# Patient Record
Sex: Male | Born: 1995 | Race: White | Hispanic: No | Marital: Single | Smoking: Current some day smoker
Health system: Southern US, Community
[De-identification: ages and names within clinical notes are randomized; demographics above are authoritative.]

---

## 2016-02-24 DIAGNOSIS — R519 Headache, unspecified: Secondary | ICD-10-CM | POA: Insufficient documentation

## 2016-03-31 DIAGNOSIS — M545 Low back pain, unspecified: Secondary | ICD-10-CM | POA: Insufficient documentation

## 2016-03-31 DIAGNOSIS — F419 Anxiety disorder, unspecified: Secondary | ICD-10-CM | POA: Insufficient documentation

## 2016-09-29 DIAGNOSIS — G43909 Migraine, unspecified, not intractable, without status migrainosus: Secondary | ICD-10-CM | POA: Insufficient documentation

## 2017-06-08 DIAGNOSIS — R369 Urethral discharge, unspecified: Secondary | ICD-10-CM | POA: Insufficient documentation

## 2017-08-23 DIAGNOSIS — B354 Tinea corporis: Secondary | ICD-10-CM | POA: Insufficient documentation

## 2017-08-23 DIAGNOSIS — L301 Dyshidrosis [pompholyx]: Secondary | ICD-10-CM | POA: Insufficient documentation

## 2017-08-23 DIAGNOSIS — F17200 Nicotine dependence, unspecified, uncomplicated: Secondary | ICD-10-CM | POA: Insufficient documentation

## 2018-04-18 ENCOUNTER — Emergency Department (HOSPITAL_COMMUNITY)
Admission: EM | Admit: 2018-04-18 | Discharge: 2018-04-18 | Disposition: A | Discharge status: Home / Self Care | Payer: Non-veteran care | Attending: Emergency Medicine | Admitting: Emergency Medicine

## 2018-04-18 ENCOUNTER — Emergency Department (HOSPITAL_COMMUNITY): Payer: Non-veteran care

## 2018-04-18 DIAGNOSIS — Y990 Civilian activity done for income or pay: Secondary | ICD-10-CM | POA: Diagnosis not present

## 2018-04-18 DIAGNOSIS — Y9289 Other specified places as the place of occurrence of the external cause: Secondary | ICD-10-CM | POA: Insufficient documentation

## 2018-04-18 DIAGNOSIS — Z23 Encounter for immunization: Secondary | ICD-10-CM | POA: Insufficient documentation

## 2018-04-18 DIAGNOSIS — W540XXD Bitten by dog, subsequent encounter: Secondary | ICD-10-CM | POA: Insufficient documentation

## 2018-04-18 DIAGNOSIS — Y939 Activity, unspecified: Secondary | ICD-10-CM | POA: Diagnosis not present

## 2018-04-18 DIAGNOSIS — S61432D Puncture wound without foreign body of left hand, subsequent encounter: Secondary | ICD-10-CM | POA: Insufficient documentation

## 2018-04-18 DIAGNOSIS — S61452D Open bite of left hand, subsequent encounter: Secondary | ICD-10-CM

## 2018-04-18 LAB — BASIC METABOLIC PANEL
Anion gap: 8 (ref 5–15)
BUN: 16 mg/dL (ref 6–20)
CALCIUM: 9.7 mg/dL (ref 8.9–10.3)
CO2: 26 mmol/L (ref 22–32)
Chloride: 104 mmol/L (ref 98–111)
Creatinine, Ser: 1.08 mg/dL (ref 0.61–1.24)
GFR calc Af Amer: >60 mL/min (ref >60)
GFR calc non Af Amer: >60 mL/min (ref >60)
GLUCOSE: 88 mg/dL (ref 70–99)
Potassium: 4.7 mmol/L (ref 3.5–5.1)
Sodium: 138 mmol/L (ref 135–145)

## 2018-04-18 LAB — CBC WITH DIFFERENTIAL/PLATELET
Abs Immature Granulocytes: 0.03 10*3/uL (ref 0.00–0.07)
BASOS PCT: 1 %
Basophils Absolute: 0.1 10*3/uL (ref 0.0–0.1)
EOS ABS: 0.2 10*3/uL (ref 0.0–0.5)
Eosinophils Relative: 3 %
HCT: 42.8 % (ref 39.0–52.0)
Hemoglobin: 15.1 g/dL (ref 13.0–17.0)
Immature Granulocytes: 1 %
Lymphocytes Relative: 25 %
Lymphs Abs: 1.7 10*3/uL (ref 0.7–4.0)
MCH: 32.8 pg (ref 26.0–34.0)
MCHC: 35.3 g/dL (ref 30.0–36.0)
MCV: 93 fL (ref 80.0–100.0)
Monocytes Absolute: 0.6 10*3/uL (ref 0.1–1.0)
Monocytes Relative: 9 %
Neutro Abs: 4 10*3/uL (ref 1.7–7.7)
Neutrophils Relative %: 61 %
Platelets: 279 10*3/uL (ref 150–400)
RBC: 4.6 MIL/uL (ref 4.22–5.81)
RDW: 11.1 % — AB (ref 11.5–15.5)
WBC: 6.5 10*3/uL (ref 4.0–10.5)
nRBC: 0 % (ref 0.0–0.2)

## 2018-04-18 MED ORDER — TETANUS-DIPHTH-ACELL PERTUSSIS 5-2.5-18.5 LF-MCG/0.5 IM SUSP
0.5000 mL | Freq: Once | INTRAMUSCULAR | Status: AC
  Administered 2018-04-18: 0.5 mL via INTRAMUSCULAR
  Filled 2018-04-18: qty 0.5

## 2018-04-18 NOTE — Discharge Instructions (Addendum)
Finish antibiotics as directed  You can take Tylenol or Ibuprofen as directed for pain. You can alternate Tylenol and Ibuprofen every 4 hours. If you take Tylenol at 1pm, then you can take Ibuprofen at 5pm. Then you can take Tylenol again at 9pm.   Return the emergency department for any high fevers, worsening redness or swelling, worsening pain or any other worsening or concerning symptoms.

## 2018-04-18 NOTE — ED Provider Notes (Signed)
[facility] EMERGENCY DEPARTMENT Provider Note   CSN: [id number] Arrival date & time: 04/18/18  1150     History   Chief Complaint Chief Complaint  Patient presents with  . Antibiotic Allergy/dog bite    HPI John Mayer is a 23 y.o. male presents for evaluation of dog bite to left hand that occurred approximately 5 days ago.  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  He reports that initially right after the incident, he went to urgent care and evaluated and started on doxycycline and Flagyl which he states he has been taking.  He states that the dog is up-to-date on his vaccines.  He does not know when his last tetanus shot was.  He reports he had had some improvement in pain but does report about 2 days ago, pain and redness started increasing.  He reports yesterday, the dorsal aspect of his left hand started swelling more significantly.  He also noted that the redness started spreading up towards the proximal wrist.  He will return to the urgent care and was prompted to come to emergency department for further evaluation.  He states that the hand is sore with movement but he has been able to have full range of motion.  Patient has not had any fevers and he denies any numbness/weakness.  The history is provided by the patient.    No past medical history on file.  There are no active problems to display for this patient.        Home Medications    Prior to Admission medications   Not on File    Family History No family history on file.  Social History Social History   Tobacco Use  . Smoking status: Not on file  Substance Use Topics  . Alcohol use: Not on file  . Drug use: Not on file     Allergies   Patient has no known allergies.   Review of Systems Review of Systems  Constitutional: Negative for fever.  Musculoskeletal:       Hand pain  Skin: Positive for color change and wound.  Neurological: Negative for weakness  and numbness.  All other systems reviewed and are negative.    Physical Exam Updated Vital Signs BP (!) 145/84 (BP Location: Right Arm)   Pulse 64   Temp 98 F (36.7 C) (Oral)   Resp 17   SpO2 97%   Physical Exam Vitals signs and nursing note reviewed.  Constitutional:      Appearance: He is well-developed.  HENT:     Head: Normocephalic and atraumatic.  Eyes:     General: No scleral icterus.       Right eye: No discharge.        Left eye: No discharge.     Conjunctiva/sclera: Conjunctivae normal.  Cardiovascular:     Pulses:          Radial pulses are 2+ on the right side and 2+ on the left side.  Pulmonary:     Effort: Pulmonary effort is normal.  Musculoskeletal:     Comments: Diffuse tenderness palpation to dorsal aspect of the left hand.  Tenderness palpation noted along the extensor tendon of the left middle digit.  Full range of motion of left wrist intact any difficulty.  Full flexion flexion-extension of all 5 digits of left hand intact without any difficulty.  DIPs with full flexion/tension when held in isolation.  Patient can easily make a  fist without any difficulty.  Skin:    General: Skin is warm and dry.     Capillary Refill: Capillary refill takes less than 2 seconds.     Comments: Good distal cap refill. LUE is not dusky in appearance or cool to touch.  Diffuse erythema and warmth overlying the dorsal aspect of the hand that extends over the MCPs but does not cross the PIP.  No overlying induration, edema.  Neurological:     Mental Status: He is alert.     Comments: Equal grip strength bilaterally. Follows commands, Moves all extremities  5/5 strength to BUE and BLE  Sensation intact throughout all major nerve distributions  Psychiatric:        Speech: Speech normal.        Behavior: Behavior normal.          ED Treatments / Results  Labs (all labs ordered are listed, but only abnormal results are displayed) Labs Reviewed  CBC WITH  DIFFERENTIAL/PLATELET - Abnormal; Notable for the following components:      Result Value   RDW 11.1 (*)    All other components within normal limits  BASIC METABOLIC PANEL    EKG None  Radiology Dg Hand Complete Left  Result Date: 04/18/2018 CLINICAL DATA:  23 year old who sustained a dog bite to the LEFT hand approximately 6 days ago, presenting with edema and erythema involving the skin extending from the level of the second metacarpal to the fifth metacarpal despite antibiotic therapy. Initial imaging encounter. EXAM: LEFT HAND - COMPLETE 3+ VIEW COMPARISON:  None. FINDINGS: No evidence of acute fracture or dislocation. No evidence of osteomyelitis. Joint spaces well preserved. Well-preserved bone mineral density. Accessory ossicle/ununited apophysis of the ulnar styloid. No significant intrinsic osseous abnormalities. IMPRESSION: No acute osseous abnormality. Electronically Signed   By: Hulan Saashomas  Lawrence M.D.   On: 04/18/2018 13:35    Procedures Procedures (including critical care time)  Medications Ordered in ED Medications  Tdap (BOOSTRIX) injection 0.5 mL (0.5 mLs Intramuscular Given 04/18/18 1246)     Initial Impression / Assessment and Plan / ED Course  I have reviewed the triage vital signs and the nursing notes.  Pertinent labs & imaging results that were available during my care of the patient were reviewed by me and considered in my medical decision making (see chart for details).    23 year old male who presents for evaluation of left hand redness and swelling that is been for the last 2 days.  Patient reports he was bitten by a dog about 5 days ago.  He was started on doxycycline and Flagyl which he states he has been taking.  Patient reports that 2 days ago, he started noticing increased redness to the dorsal aspect of left hand and states that over the last 3 to 4 hours, the redness and swelling has progressively worsened.  He states he has some soreness with moving the  hand but has full range of motion.  No fevers noted. Patient is afebrile, non-toxic appearing, sitting comfortably on examination table. Vital signs reviewed and stable. Patient is neurovascularly intact.   CBC shows no cytosis.  BMP unremarkable.  Discussed patient with Dr. Janee Mornhompson (Hand).  Will come evaluate patient in the ED.  Discussed with Dr. Janee Mornhompson after evaluation in ED.  He suspect that this is more of a result of traumatic injury rather than infectious process.  There is no edema or induration.  Additionally, he states that when this initially happened, it bled significantly.  Recommends continuing antibiotic as directed and adding anti-inflammatories to patient's treatment course.  Patient is agreeable to plan.Patient given outpatient follow-up. Patient had ample opportunity for questions and discussion. All patient's questions were answered with full understanding. Strict return precautions discussed. Patient expresses understanding and agreement to plan.   Final Clinical Impressions(s) / ED Diagnoses   Final diagnoses:  Dog bite of left hand, subsequent encounter    ED Discharge Orders    None       Rosana HoesLayden, Lindsey A, PA-C 04/19/18 1954    Rolan BuccoBelfi, Melanie, MD 04/19/18 2320

## 2018-04-18 NOTE — Consult Note (Signed)
ORTHOPAEDIC CONSULTATION HISTORY & PHYSICAL REQUESTING PHYSICIAN: Rolan Bucco, MD  Chief Complaint: left hand problem  HPI: John Mayer is a 23 y.o. male who sustained some superficial punctures from a dog bite this past Friday.  He was initially started that day on oral antibiotics.  Although he remains with minimal pain and no significant swelling, he has developed some increased redness on the dorsum of the hand more distally.  I was consulted regarding treatment recommendations. In discussing this with him, he had fairly heavy venous bleeding at the time of the injury, from 1 of the more distal punctures.  He reports to been taking his antibiotics as prescribed.  No past medical history on file.  Social History   Socioeconomic History  . Marital status: Single    Spouse name: Not on file  . Number of children: Not on file  . Years of education: Not on file  . Highest education level: Not on file  Occupational History  . Not on file  Social Needs  . Financial resource strain: Not on file  . Food insecurity:    Worry: Not on file    Inability: Not on file  . Transportation needs:    Medical: Not on file    Non-medical: Not on file  Tobacco Use  . Smoking status: Not on file  Substance and Sexual Activity  . Alcohol use: Not on file  . Drug use: Not on file  . Sexual activity: Not on file  Lifestyle  . Physical activity:    Days per week: Not on file    Minutes per session: Not on file  . Stress: Not on file  Relationships  . Social connections:    Talks on phone: Not on file    Gets together: Not on file    Attends religious service: Not on file    Active member of club or organization: Not on file    Attends meetings of clubs or organizations: Not on file    Relationship status: Not on file  Other Topics Concern  . Not on file  Social History Narrative  . Not on file   No family history on file. No Known Allergies Prior to Admission medications   Not  on File   Dg Hand Complete Left  Result Date: 04/18/2018 CLINICAL DATA:  23 year old who sustained a dog bite to the LEFT hand approximately 6 days ago, presenting with edema and erythema involving the skin extending from the level of the second metacarpal to the fifth metacarpal despite antibiotic therapy. Initial imaging encounter. EXAM: LEFT HAND - COMPLETE 3+ VIEW COMPARISON:  None. FINDINGS: No evidence of acute fracture or dislocation. No evidence of osteomyelitis. Joint spaces well preserved. Well-preserved bone mineral density. Accessory ossicle/ununited apophysis of the ulnar styloid. No significant intrinsic osseous abnormalities. IMPRESSION: No acute osseous abnormality. Electronically Signed   By: Hulan Saas M.D.   On: 04/18/2018 13:35    Positive ROS: All other systems have been reviewed and were otherwise negative with the exception of those mentioned in the HPI and as above.  Physical Exam: Vitals: Refer to EMR. Constitutional:  WD, WN, NAD HEENT:  NCAT, EOMI Neuro/Psych:  Alert & oriented to person, place, and time; appropriate mood & affect Lymphatic: No generalized extremity edema or lymphadenopathy Extremities / MSK:  The extremities are normal with respect to appearance, ranges of motion, joint stability, muscle strength/tone, sensation, & perfusion except as otherwise noted:  The left hand has no edema.  There  is some reddish discoloration of the skin that is essentially distal to all of the wounds, and the position of more dependency.  He has full active flexion extension of the digits with minimal discomfort.  There is no drainage from any of the wounds, and no redness associated with any of the wounds themselves.  WBC 6.5  Assessment: Left hand redness, likely not of infectious origin, more likely to be subcutaneous hemorrhage tracking into the dependent portions of the hand  Plan: I discussed these findings with him and the requesting provider.  I encouraged him  to continue his antibiotics until gone, adding an oral anti-inflammatory to the mix, working to preserve motion, and seeking reevaluation if his condition deteriorates, especially as it relates to edema and pain.  I indicated that the areas that are presently a little bit reddened in color may actually become darker like more characteristic ecchymosis.    Cliffton Astersavid A. Janee Mornhompson, MD      Orthopaedic & Hand Surgery [facility] [address] [place], [place]  [id number] Office: [phone] Mobile: [phone]  04/18/2018, 2:49 PM

## 2018-04-18 NOTE — ED Triage Notes (Signed)
Patient arrived from home reporting that he has been taking antibiotics since Dec 27th related to dog bite. He reported that his left hand became red and he believes this may be related to the antibiotics

## 2018-04-18 NOTE — ED Notes (Signed)
D/c reviewed with patient 

## 2019-12-05 DIAGNOSIS — M25529 Pain in unspecified elbow: Secondary | ICD-10-CM | POA: Insufficient documentation

## 2019-12-07 DIAGNOSIS — F4310 Post-traumatic stress disorder, unspecified: Secondary | ICD-10-CM | POA: Insufficient documentation

## 2019-12-07 DIAGNOSIS — L409 Psoriasis, unspecified: Secondary | ICD-10-CM | POA: Insufficient documentation

## 2019-12-07 DIAGNOSIS — A630 Anogenital (venereal) warts: Secondary | ICD-10-CM | POA: Insufficient documentation

## 2020-03-02 DIAGNOSIS — D3131 Benign neoplasm of right choroid: Secondary | ICD-10-CM | POA: Insufficient documentation

## 2020-06-05 ENCOUNTER — Encounter: Payer: Self-pay | Admitting: Emergency Medicine

## 2020-06-05 ENCOUNTER — Other Ambulatory Visit: Payer: Self-pay

## 2020-06-05 ENCOUNTER — Emergency Department
Admission: EM | Admit: 2020-06-05 | Discharge: 2020-06-05 | Disposition: A | Discharge status: Home / Self Care | Payer: No Typology Code available for payment source | Attending: Emergency Medicine | Admitting: Emergency Medicine

## 2020-06-05 DIAGNOSIS — F17200 Nicotine dependence, unspecified, uncomplicated: Secondary | ICD-10-CM | POA: Diagnosis not present

## 2020-06-05 DIAGNOSIS — H5713 Ocular pain, bilateral: Secondary | ICD-10-CM | POA: Diagnosis present

## 2020-06-05 DIAGNOSIS — H10023 Other mucopurulent conjunctivitis, bilateral: Secondary | ICD-10-CM | POA: Insufficient documentation

## 2020-06-05 MED ORDER — GENTAMICIN SULFATE 0.3 % OP SOLN: 1.0000 [drp] | OPHTHALMIC | 0 refills | Status: DC

## 2020-06-05 NOTE — Discharge Instructions (Addendum)
Follow discharge care instructions and use eyedrops as directed. 

## 2020-06-05 NOTE — ED Triage Notes (Signed)
Pt comes into the ED via POV c/o left eye pain and discharge since Tuesday.  Pt in [facility].

## 2020-06-05 NOTE — ED Provider Notes (Signed)
[facility] Emergency Department Provider Note   ____________________________________________   Event Date/Time   First MD Initiated Contact with Patient 06/05/20 1151     (approximate)  I have reviewed the triage vital signs and the nursing notes.   HISTORY  Chief Complaint Eye Pain    HPI [name] is a 25 y.o. male patient presents with 3 days of left pain and matted eyelids.  Patient stated complaint is spreading to the right.  Patient denies vision disturbance.  Patient do not wear contact lenses.  Rates pain as 1/10.  Described pain is "achy".  No palliative measure for complaint.         History reviewed. No pertinent past medical history.  There are no problems to display for this patient.   History reviewed. No pertinent surgical history.  Prior to Admission medications   Medication Sig Start Date End Date Taking? Authorizing Provider  gentamicin (GARAMYCIN) 0.3 % ophthalmic solution Place 1 drop into both eyes every 4 (four) hours. 06/05/20  Yes Joni Reining, PA-C    Allergies Patient has no known allergies.  History reviewed. No pertinent family history.  Social History Social History   Tobacco Use  . Smoking status: Current Some Day Smoker  . Smokeless tobacco: Never Used  Substance Use Topics  . Alcohol use: Yes    Review of Systems Constitutional: No fever/chills Eyes: No visual changes.  Dried greenish-yellow material upper and lower eyelids left eye.  ENT: No sore throat. Cardiovascular: Denies chest pain. Respiratory: Denies shortness of breath. Gastrointestinal: No abdominal pain.  No nausea, no vomiting.  No diarrhea.  No constipation. Genitourinary: Negative for dysuria. Musculoskeletal: Negative for back pain. Skin: Negative for rash. Neurological: Negative for headaches, focal weakness or numbness.   ____________________________________________   PHYSICAL EXAM:  VITAL SIGNS: ED Triage Vitals   Enc Vitals Group     BP 06/05/20 1140 136/84     Pulse Rate 06/05/20 1140 (!) 57     Resp 06/05/20 1140 16     Temp 06/05/20 1140 97.9 F (36.6 C)     Temp Source 06/05/20 1140 Oral     SpO2 06/05/20 1140 97 %     Weight 06/05/20 1134 220 lb (99.8 kg)     Height 06/05/20 1134 6\' 2"  (1.88 m)     Head Circumference --      Peak Flow --      Pain Score 06/05/20 1134 1     Pain Loc --      Pain Edu? --      Excl. in GC? --    Constitutional: Alert and oriented. Well appearing and in no acute distress. Eyes: Conjunctivae are erythematous. PERRL. EOMI. dried yellow-greenish secretion inferior and superior eyelids left 5. Nose: No congestion/rhinnorhea. Cardiovascular: Asymptomatic bradycardia, regular rhythm. Grossly normal heart sounds.  Good peripheral circulation. Respiratory: Normal respiratory effort.  No retractions. Lungs CTAB. Skin:  Skin is warm, dry and intact. No rash noted. Psychiatric: Mood and affect are normal. Speech and behavior are normal.  ____________________________________________   LABS (all labs ordered are listed, but only abnormal results are displayed)  Labs Reviewed - No data to display ____________________________________________  EKG   ____________________________________________  RADIOLOGY I, 06/07/20, personally viewed and evaluated these images (plain radiographs) as part of my medical decision making, as well as reviewing the written report by the radiologist.  ED MD interpretation:   Official radiology report(s): No results found.  ____________________________________________  PROCEDURES  Procedure(s) performed (including Critical Care):  Procedures   ____________________________________________   INITIAL IMPRESSION / ASSESSMENT AND PLAN / ED COURSE  As part of my medical decision making, I reviewed the following data within the electronic MEDICAL RECORD NUMBER         Patient presents with matted eyelids for 3 days.   Patient complaint physical exam is consistent with conjunctivitis.  Patient given discharge care instructions and advised use eyedrops as directed.  Follow-up with PCP if no improvement or worsening complaint.     ____________________________________________   FINAL CLINICAL IMPRESSION(S) / ED DIAGNOSES  Final diagnoses:  Other mucopurulent conjunctivitis of both eyes     ED Discharge Orders         Ordered    gentamicin (GARAMYCIN) 0.3 % ophthalmic solution  Every 4 hours        06/05/20 1200          *Please note:  [name] was evaluated in Emergency Department on 06/05/2020 for the symptoms described in the history of present illness. He was evaluated in the context of the global COVID-19 pandemic, which necessitated consideration that the patient might be at risk for infection with the SARS-CoV-2 virus that causes COVID-19. Institutional protocols and algorithms that pertain to the evaluation of patients at risk for COVID-19 are in a state of rapid change based on information released by regulatory bodies including the CDC and federal and state organizations. These policies and algorithms were followed during the patient's care in the ED.  Some ED evaluations and interventions may be delayed as a result of limited staffing during and the pandemic.*   Note:  This document was prepared using Dragon voice recognition software and may include unintentional dictation errors.    Joni Reining, PA-C 06/05/20 1210    Sharman Cheek, MD 06/05/20 1538

## 2020-07-02 ENCOUNTER — Other Ambulatory Visit: Payer: Self-pay

## 2020-07-02 ENCOUNTER — Emergency Department: Payer: No Typology Code available for payment source

## 2020-07-02 ENCOUNTER — Encounter: Payer: Self-pay | Admitting: Emergency Medicine

## 2020-07-02 ENCOUNTER — Emergency Department
Admission: EM | Admit: 2020-07-02 | Discharge: 2020-07-02 | Disposition: A | Discharge status: Home / Self Care | Payer: No Typology Code available for payment source | Attending: Emergency Medicine | Admitting: Emergency Medicine

## 2020-07-02 ENCOUNTER — Other Ambulatory Visit: Payer: Self-pay | Admitting: Oncology

## 2020-07-02 DIAGNOSIS — R0602 Shortness of breath: Secondary | ICD-10-CM | POA: Diagnosis not present

## 2020-07-02 DIAGNOSIS — R0789 Other chest pain: Secondary | ICD-10-CM | POA: Diagnosis present

## 2020-07-02 DIAGNOSIS — R918 Other nonspecific abnormal finding of lung field: Secondary | ICD-10-CM

## 2020-07-02 DIAGNOSIS — Z8616 Personal history of COVID-19: Secondary | ICD-10-CM | POA: Diagnosis not present

## 2020-07-02 DIAGNOSIS — R911 Solitary pulmonary nodule: Secondary | ICD-10-CM | POA: Diagnosis not present

## 2020-07-02 DIAGNOSIS — R042 Hemoptysis: Secondary | ICD-10-CM

## 2020-07-02 DIAGNOSIS — F17200 Nicotine dependence, unspecified, uncomplicated: Secondary | ICD-10-CM | POA: Insufficient documentation

## 2020-07-02 DIAGNOSIS — R079 Chest pain, unspecified: Secondary | ICD-10-CM | POA: Diagnosis not present

## 2020-07-02 DIAGNOSIS — R059 Cough, unspecified: Secondary | ICD-10-CM

## 2020-07-02 LAB — CBC WITH DIFFERENTIAL/PLATELET
Abs Immature Granulocytes: 0.03 10*3/uL (ref 0.00–0.07)
Basophils Absolute: 0.1 10*3/uL (ref 0.0–0.1)
Basophils Relative: 1 %
Eosinophils Absolute: 0.7 10*3/uL — ABNORMAL HIGH (ref 0.0–0.5)
Eosinophils Relative: 6 %
HCT: 39.2 % (ref 39.0–52.0)
Hemoglobin: 13.8 g/dL (ref 13.0–17.0)
Immature Granulocytes: 0 %
Lymphocytes Relative: 15 %
Lymphs Abs: 1.6 10*3/uL (ref 0.7–4.0)
MCH: 33 pg (ref 26.0–34.0)
MCHC: 35.2 g/dL (ref 30.0–36.0)
MCV: 93.8 fL (ref 80.0–100.0)
Monocytes Absolute: 0.9 10*3/uL (ref 0.1–1.0)
Monocytes Relative: 9 %
Neutro Abs: 7.3 10*3/uL (ref 1.7–7.7)
Neutrophils Relative %: 69 %
Platelets: 251 10*3/uL (ref 150–400)
RBC: 4.18 MIL/uL — ABNORMAL LOW (ref 4.22–5.81)
RDW: 12 % (ref 11.5–15.5)
WBC: 10.6 10*3/uL — ABNORMAL HIGH (ref 4.0–10.5)
nRBC: 0 % (ref 0.0–0.2)

## 2020-07-02 LAB — TROPONIN I (HIGH SENSITIVITY)
Troponin I (High Sensitivity): 15 ng/L (ref <18)
Troponin I (High Sensitivity): 14 ng/L (ref <18)

## 2020-07-02 LAB — BASIC METABOLIC PANEL
Anion gap: 10 (ref 5–15)
BUN: 15 mg/dL (ref 6–20)
CO2: 23 mmol/L (ref 22–32)
Calcium: 9 mg/dL (ref 8.9–10.3)
Chloride: 105 mmol/L (ref 98–111)
Creatinine, Ser: 1.04 mg/dL (ref 0.61–1.24)
GFR, Estimated: >60 mL/min (ref >60)
Glucose, Bld: 96 mg/dL (ref 70–99)
Potassium: 3.7 mmol/L (ref 3.5–5.1)
Sodium: 138 mmol/L (ref 135–145)

## 2020-07-02 IMAGING — CT CT ANGIO CHEST
2 of 6 series · 19 of 46 positions shown · IV contrast (APPLIED)
Comparison: Chest x-ray [DATE]

CLINICAL DATA: Pt reports NORELIA cough and congestion x 3 days, st
some blood noted in sputum. PE suspected, high prob covid in [DATE],
cp/sob, hemoptysis

EXAM:
CT ANGIOGRAPHY CHEST WITH CONTRAST
TECHNIQUE: Multidetector CT imaging of the chest was performed using the
standard protocol during bolus administration of intravenous
contrast. Multiplanar CT image reconstructions and MIPs were
obtained to evaluate the vascular anatomy.
CONTRAST:  75mL OMNIPAQUE IOHEXOL 350 MG/ML SOLN

[Series 7: thins · axial · 0.74mm/px · z∈[-306,-3]mm · 16 of 333 slices shown]
[im 15/333  lung]
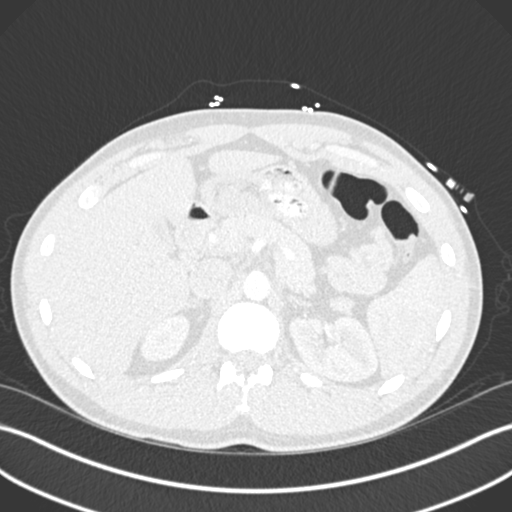
[im 44/333  soft-tissue]
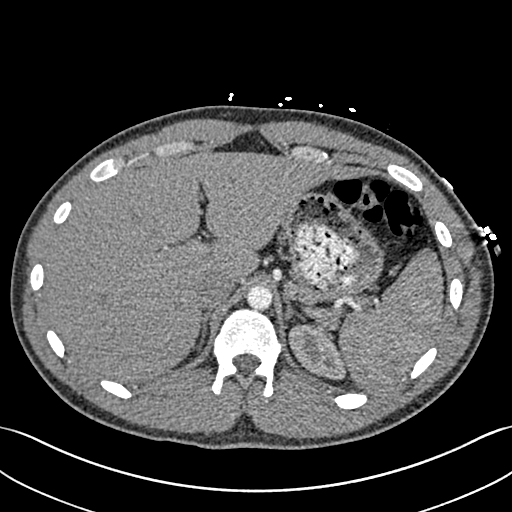
[im 58/333  lung]
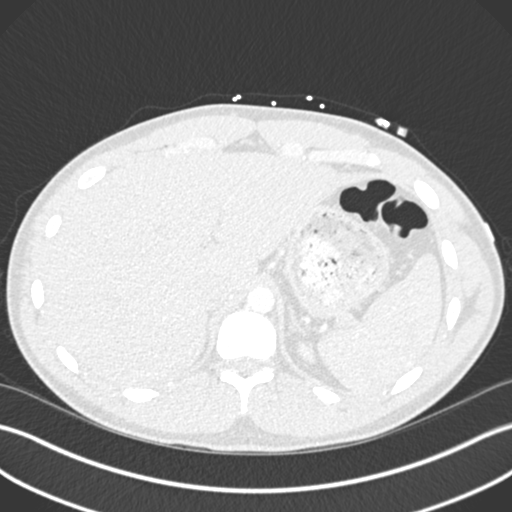
[im 73/333  soft-tissue]
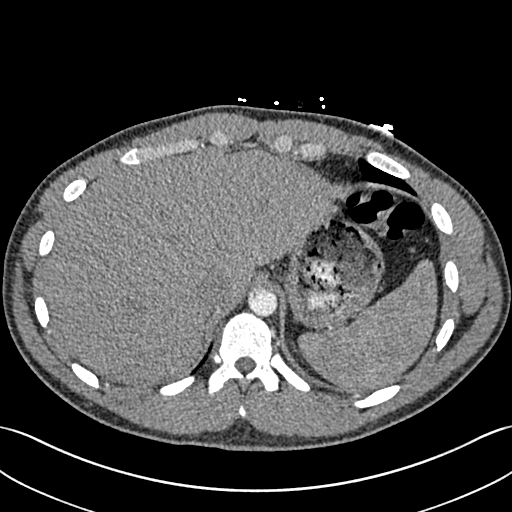
[im 102/333  lung]
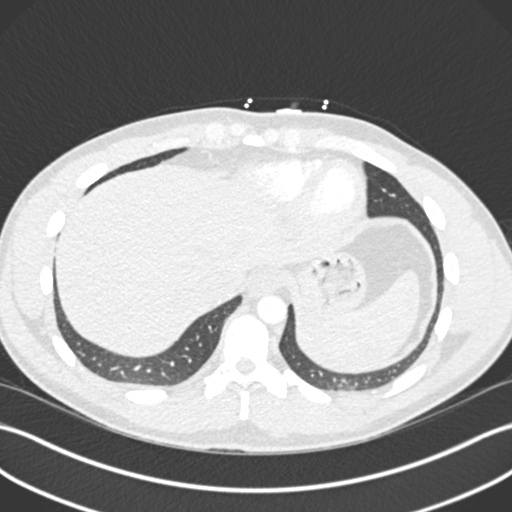
[im 116/333  soft-tissue]
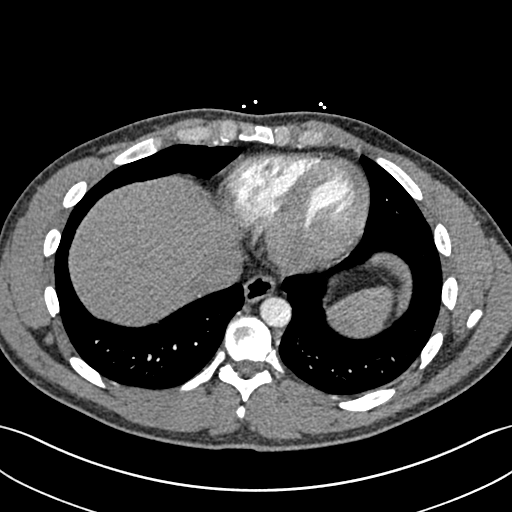
[im 130/333  lung]
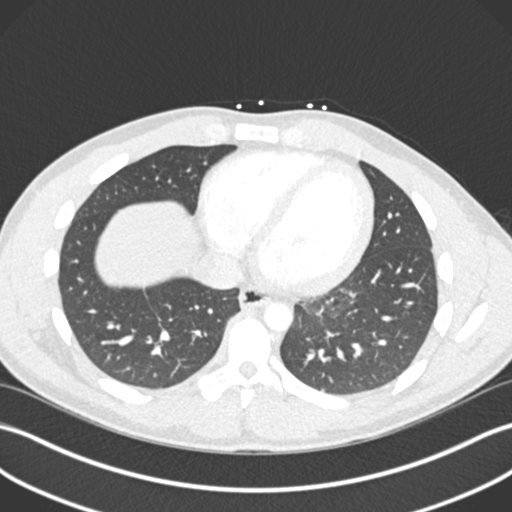
[im 159/333  soft-tissue]
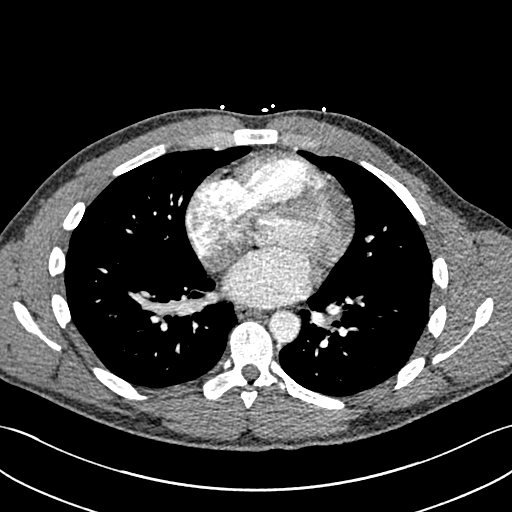
[im 174/333  lung]
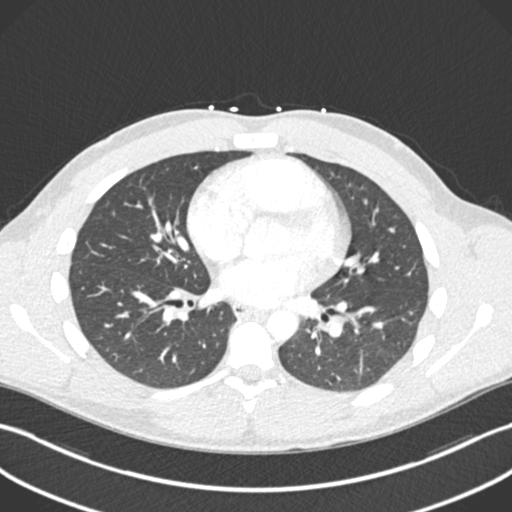
[im 203/333  soft-tissue]
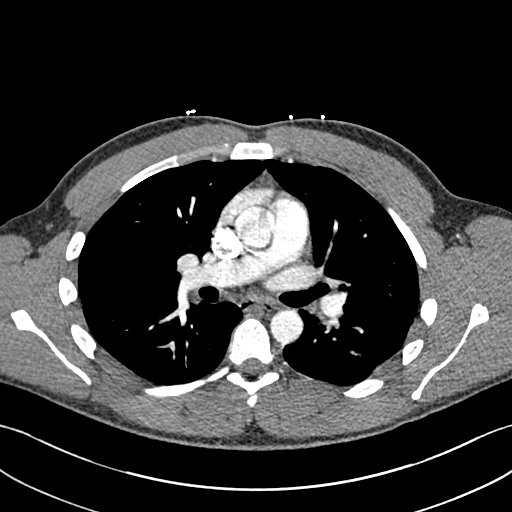
[im 217/333  lung]
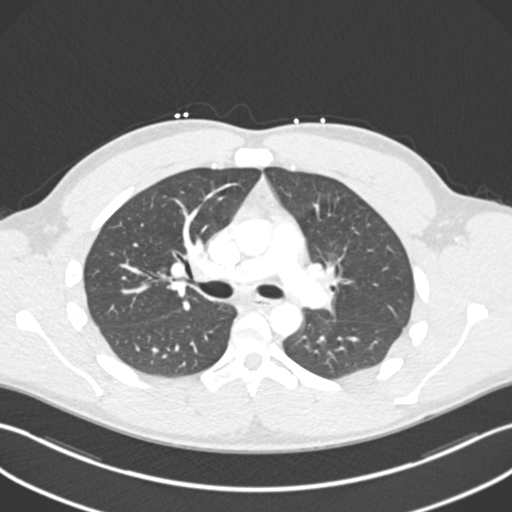
[im 231/333  soft-tissue]
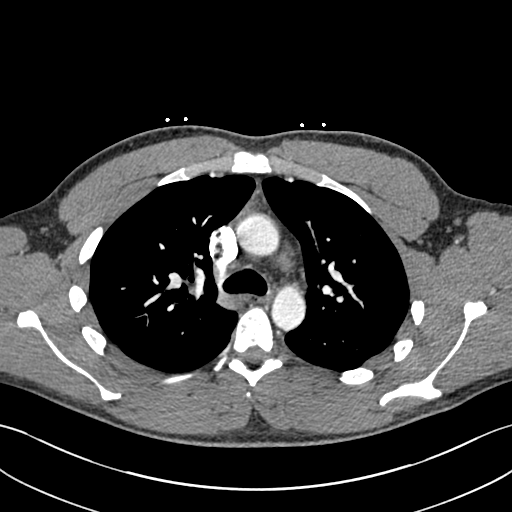
[im 260/333  lung]
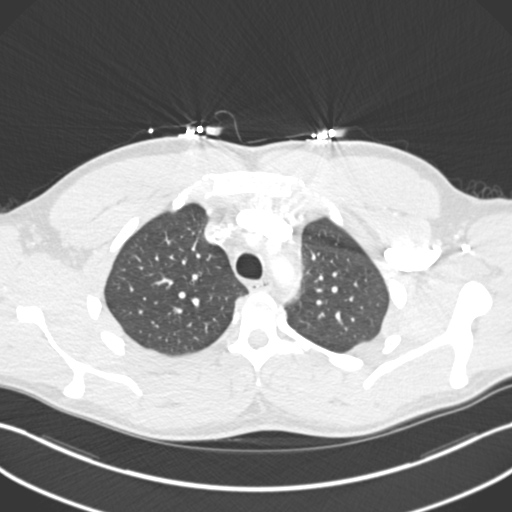
[im 275/333  soft-tissue]
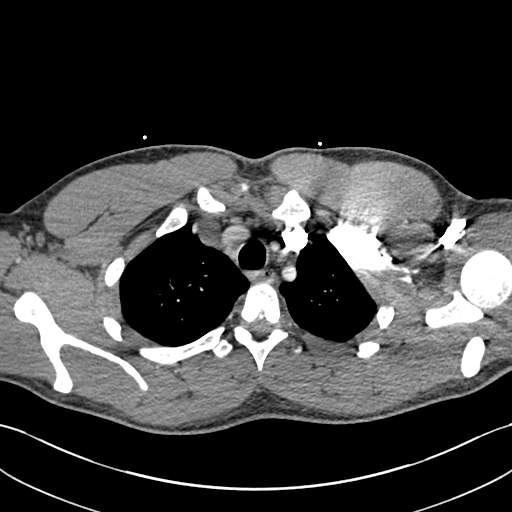
[im 289/333  lung]
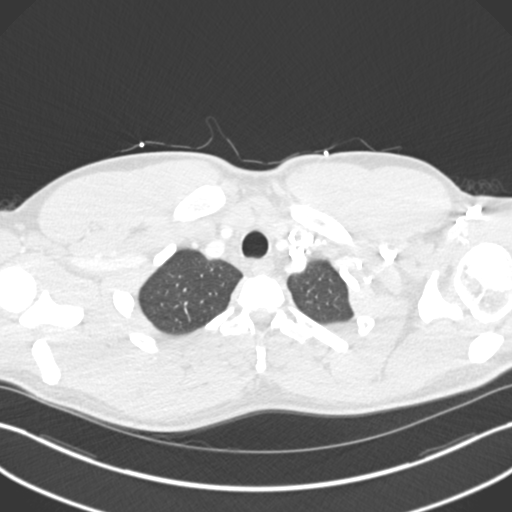
[im 318/333  soft-tissue]
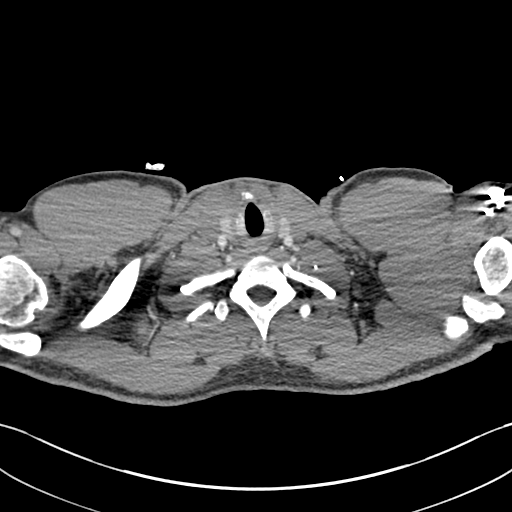

[Series 9: coronal mpr · coronal · 0.65mm/px · 3 of 96 slices shown]
[im 24/96  soft-tissue]
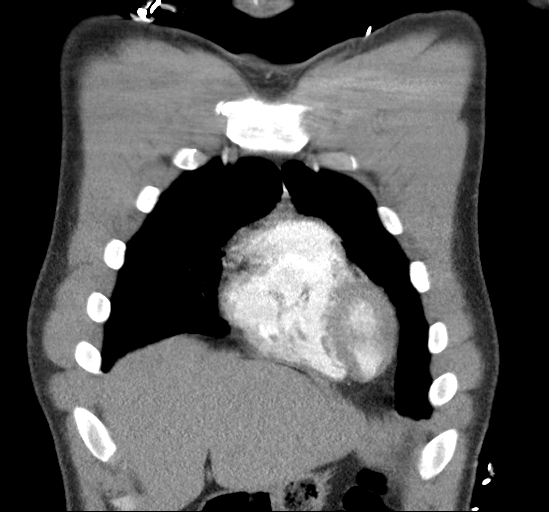
[im 48/96  soft-tissue]
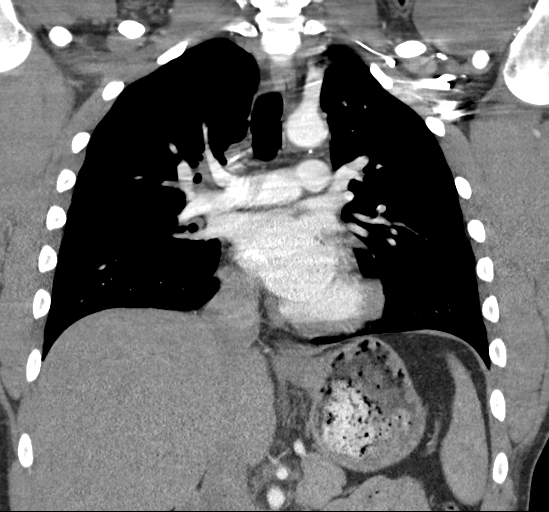
[im 72/96  soft-tissue]
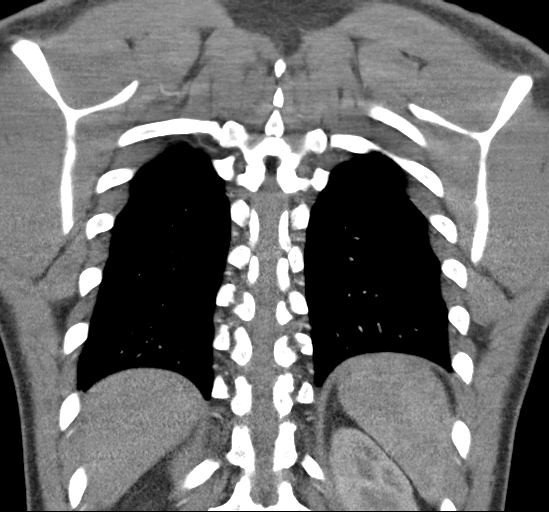

[19 of 46 positions shown; findings below may reference images not displayed]

FINDINGS: Cardiovascular: Fairly satisfactory opacification of the pulmonary
arteries to the segmental level. No evidence of pulmonary embolism.
The main pulmonary artery is normal in caliber. Normal heart size.
No significant pericardial effusion. The thoracic aorta is normal in
caliber. No atherosclerotic plaque of the thoracic aorta. No
coronary artery calcifications.

Mediastinum/Nodes: No enlarged mediastinal, hilar, or axillary lymph
nodes. Thyroid gland, trachea, and esophagus demonstrate no
significant findings.

Lungs/Pleura: No ground-glass airspace opacity. No focal
consolidation. 5 mm left upper lobe nodule ([DATE]). 5 mm left lower
lobe pulmonary nodule ([DATE]). Pulmonary micronodule within the right
upper lobe ([DATE]). No pleural effusion. No pneumothorax. Mild
diffuse bronchial wall thickening.

Upper Abdomen: No acute abnormality.

Musculoskeletal: No chest wall abnormality. No acute or significant
osseous findings.

Review of the MIP images confirms the above findings.
IMPRESSION: 1. No pulmonary embolus.
2. Indeterminate 5 mm left lower, 5 mm left upper, and 3 mm right
upper lobe pulmonary nodules.
3. No other acute intrathoracic abnormality.

## 2020-07-02 MED ORDER — IOHEXOL 350 MG/ML SOLN
75.0000 mL | Freq: Once | INTRAVENOUS | Status: AC | PRN
  Administered 2020-07-02: 75 mL via INTRAVENOUS

## 2020-07-02 MED ORDER — SODIUM CHLORIDE 0.9 % IV BOLUS
1000.0000 mL | Freq: Once | INTRAVENOUS | Status: AC
  Administered 2020-07-02: 1000 mL via INTRAVENOUS

## 2020-07-02 MED ORDER — ALBUTEROL SULFATE (2.5 MG/3ML) 0.083% IN NEBU: 2.5000 mg | INHALATION_SOLUTION | RESPIRATORY_TRACT | 0 refills | Status: DC | PRN

## 2020-07-02 MED ORDER — HYDROCOD POLST-CPM POLST ER 10-8 MG/5ML PO SUER
5.0000 mL | Freq: Once | ORAL | Status: AC
  Administered 2020-07-02: 5 mL via ORAL
  Filled 2020-07-02: qty 5

## 2020-07-02 MED ORDER — IPRATROPIUM-ALBUTEROL 0.5-2.5 (3) MG/3ML IN SOLN
3.0000 mL | Freq: Once | RESPIRATORY_TRACT | Status: AC
  Administered 2020-07-02: 3 mL via RESPIRATORY_TRACT
  Filled 2020-07-02: qty 3

## 2020-07-02 MED ORDER — METHYLPREDNISOLONE SODIUM SUCC 125 MG IJ SOLR
125.0000 mg | Freq: Once | INTRAMUSCULAR | Status: AC
  Administered 2020-07-02: 125 mg via INTRAVENOUS
  Filled 2020-07-02: qty 2

## 2020-07-02 MED ORDER — HYDROCOD POLST-CPM POLST ER 10-8 MG/5ML PO SUER: 5.0000 mL | Freq: Two times a day (BID) | ORAL | 0 refills | Status: DC

## 2020-07-02 NOTE — ED Notes (Signed)
Pt to ct 

## 2020-07-02 NOTE — ED Provider Notes (Signed)
[facility] Emergency Department Provider Note   ____________________________________________   Event Date/Time   First MD Initiated Contact with Patient 07/02/20 [phone]     (approximate)  I have reviewed the triage vital signs and the nursing notes.   HISTORY  Chief Complaint Cough    HPI John Mayer is a 25 y.o. male who presents to the ED from home with a chief complaint of productive cough, congestion, chest pain, shortness of breath and hemoptysis.  Patient had COVID-19 in January 2022 with predominantly headache and respiratory symptoms.  He is unvaccinated.  Reports a 3-day history of cough productive of yellow sputum, congestion with chest tightness and shortness of breath.  Noted blood in sputum tonight.  Denies fever, chills, abdominal pain, nausea, vomiting or diarrhea.  Denies recent travel, trauma or hormone use     Past medical history COVID-19  There are no problems to display for this patient.   History reviewed. No pertinent surgical history.  Prior to Admission medications   Medication Sig Start Date End Date Taking? Authorizing Provider  albuterol (PROVENTIL) (2.5 MG/3ML) 0.083% nebulizer solution Take 3 mLs (2.5 mg total) by nebulization every 4 (four) hours as needed for wheezing or shortness of breath. 07/02/20  Yes Irean Hong, MD  chlorpheniramine-HYDROcodone [facility] PENNKINETIC ER) 10-8 MG/5ML SUER Take 5 mLs by mouth 2 (two) times daily. 07/02/20  Yes Irean Hong, MD  gentamicin (GARAMYCIN) 0.3 % ophthalmic solution Place 1 drop into both eyes every 4 (four) hours. 06/05/20   Joni Reining, PA-C    Allergies Patient has no known allergies.  No family history on file.  Social History Social History   Tobacco Use  . Smoking status: Current Some Day Smoker  . Smokeless tobacco: Never Used  Vaping Use  . Vaping Use: Never used  Substance Use Topics  . Alcohol use: Yes    Review of Systems  Constitutional:  No fever/chills Eyes: No visual changes. ENT: No sore throat. Cardiovascular: Positive for chest tightness. Respiratory: Positive for cough and shortness of breath. Gastrointestinal: No abdominal pain.  No nausea, no vomiting.  No diarrhea.  No constipation. Genitourinary: Negative for dysuria. Musculoskeletal: Negative for back pain. Skin: Negative for rash. Neurological: Negative for headaches, focal weakness or numbness.   ____________________________________________   PHYSICAL EXAM:  VITAL SIGNS: ED Triage Vitals  Enc Vitals Group     BP 07/02/20 0052 137/83     Pulse Rate 07/02/20 0051 (!) 103     Resp 07/02/20 0051 20     Temp 07/02/20 0051 98.4 F (36.9 C)     Temp Source 07/02/20 0051 Oral     SpO2 07/02/20 0051 96 %     Weight 07/02/20 0049 220 lb (99.8 kg)     Height 07/02/20 0049 6\' 2"  (1.88 m)     Head Circumference --      Peak Flow --      Pain Score 07/02/20 0049 0     Pain Loc --      Pain Edu? --      Excl. in GC? --     Constitutional: Alert and oriented. Well appearing and in mild acute distress. Eyes: Conjunctivae are normal. PERRL. EOMI. Head: Atraumatic. Nose: Congestion/rhinnorhea. Mouth/Throat: Mucous membranes are moist.   Neck: No stridor.   Cardiovascular: Normal rate, regular rhythm. Grossly normal heart sounds.  Good peripheral circulation. Respiratory: Normal respiratory effort.  No retractions. Lungs CTAB.  Active cough noted. Gastrointestinal:  Soft and nontender. No distention. No abdominal bruits. No CVA tenderness. Musculoskeletal: No lower extremity tenderness nor edema.  No joint effusions. Neurologic:  Normal speech and language. No gross focal neurologic deficits are appreciated. No gait instability. Skin:  Skin is warm, dry and intact. No rash noted. Psychiatric: Mood and affect are normal. Speech and behavior are normal.  ____________________________________________   LABS (all labs ordered are listed, but only abnormal  results are displayed)  Labs Reviewed  CBC WITH DIFFERENTIAL/PLATELET - Abnormal; Notable for the following components:      Result Value   WBC 10.6 (*)    RBC 4.18 (*)    Eosinophils Absolute 0.7 (*)    All other components within normal limits  BASIC METABOLIC PANEL  TROPONIN I (HIGH SENSITIVITY)  TROPONIN I (HIGH SENSITIVITY)   ____________________________________________  EKG  ED ECG REPORT I, Gwyneth Fernandez J, the attending physician, personally viewed and interpreted this ECG.   Date: 07/02/2020  EKG Time: 0342  Rate: 79  Rhythm: normal EKG, normal sinus rhythm  Axis: Normal  Intervals:none  ST&T Change: Nonspecific  ____________________________________________  RADIOLOGY I, Trea Carnegie J, personally viewed and evaluated these images (plain radiographs) as part of my medical decision making, as well as reviewing the written report by the radiologist.  ED MD interpretation: No acute cardiopulmonary process; CTA demonstrates no PE but multiple pulmonary nodules  Official radiology report(s): DG Chest 2 View  Result Date: 07/02/2020 CLINICAL DATA:  Cough hemoptysis EXAM: CHEST - 2 VIEW COMPARISON:  None. FINDINGS: The heart size and mediastinal contours are within normal limits. Both lungs are clear. The visualized skeletal structures are unremarkable. IMPRESSION: No active cardiopulmonary disease. Electronically Signed   By: Jasmine Pang M.D.   On: 07/02/2020 01:31   CT Angio Chest PE W/Cm &/Or Wo Cm  Result Date: 07/02/2020 CLINICAL DATA:  Pt reports prod cough and congestion x 3 days, st some blood noted in sputum. PE suspected, high prob covid in 04/2020, cp/sob, hemoptysis EXAM: CT ANGIOGRAPHY CHEST WITH CONTRAST TECHNIQUE: Multidetector CT imaging of the chest was performed using the standard protocol during bolus administration of intravenous contrast. Multiplanar CT image reconstructions and MIPs were obtained to evaluate the vascular anatomy. CONTRAST:  78mL OMNIPAQUE  IOHEXOL 350 MG/ML SOLN COMPARISON:  Chest x-ray 07/02/2020 FINDINGS: Cardiovascular: Fairly satisfactory opacification of the pulmonary arteries to the segmental level. No evidence of pulmonary embolism. The main pulmonary artery is normal in caliber. Normal heart size. No significant pericardial effusion. The thoracic aorta is normal in caliber. No atherosclerotic plaque of the thoracic aorta. No coronary artery calcifications. Mediastinum/Nodes: No enlarged mediastinal, hilar, or axillary lymph nodes. Thyroid gland, trachea, and esophagus demonstrate no significant findings. Lungs/Pleura: No ground-glass airspace opacity. No focal consolidation. 5 mm left upper lobe nodule (8:54). 5 mm left lower lobe pulmonary nodule (8:65). Pulmonary micronodule within the right upper lobe (8:23). No pleural effusion. No pneumothorax. Mild diffuse bronchial wall thickening. Upper Abdomen: No acute abnormality. Musculoskeletal: No chest wall abnormality. No acute or significant osseous findings. Review of the MIP images confirms the above findings. IMPRESSION: 1. No pulmonary embolus. 2. Indeterminate 5 mm left lower, 5 mm left upper, and 3 mm right upper lobe pulmonary nodules. 3. No other acute intrathoracic abnormality. Electronically Signed   By: Tish Frederickson M.D.   On: 07/02/2020 05:10    ____________________________________________   PROCEDURES  Procedure(s) performed (including Critical Care):  Procedures   ____________________________________________   INITIAL IMPRESSION / ASSESSMENT AND PLAN / ED COURSE  As part of my medical decision making, I reviewed the following data within the electronic MEDICAL RECORD NUMBER Nursing notes reviewed and incorporated, Labs reviewed, EKG interpreted, Old chart reviewed, Radiograph reviewed and Notes from prior ED visits     25 year old male presenting with cough, congestion, chest pain, shortness of breath and hemoptysis; had COVID-19 in January 2022. Differential  includes, but is not limited to, viral syndrome, bronchitis including COPD exacerbation, pneumonia, reactive airway disease including asthma, CHF including exacerbation with or without pulmonary/interstitial edema, pneumothorax, ACS, thoracic trauma, and pulmonary embolism.  Given patient's symptoms and recent COVID-19 infection, symptoms are concerning for possible PE.  Will obtain basic lab work, CTA chest to evaluate for PE.  Administer IV steroid, Tussionex and DuoNeb.  Will reassess.  Clinical Course as of 07/02/20 0617  Thu Jul 02, 2020  0551 Patient resting in no acute distress.  Updated him on CT imaging results.  Will refer patient to pulmonary nodule clinic for follow-up.  Discharged home with prescription for Tussionex and refill of albuterol solution.  Strict return precautions given.  Patient verbalizes understanding and agrees with plan of care. [JS]    Clinical Course User Index [JS] Irean Hong, MD     ____________________________________________   FINAL CLINICAL IMPRESSION(S) / ED DIAGNOSES  Final diagnoses:  Hemoptysis  Cough  Pulmonary nodules     ED Discharge Orders         Ordered    albuterol (PROVENTIL) (2.5 MG/3ML) 0.083% nebulizer solution  Every 4 hours PRN        07/02/20 0552    chlorpheniramine-HYDROcodone (TUSSIONEX PENNKINETIC ER) 10-8 MG/5ML SUER  2 times daily        07/02/20 0552    AMB  Referral to Pulmonary Nodule Clinic        07/02/20 0555          *Please note:  John Mayer was evaluated in Emergency Department on 07/02/2020 for the symptoms described in the history of present illness. He was evaluated in the context of the global COVID-19 pandemic, which necessitated consideration that the patient might be at risk for infection with the SARS-CoV-2 virus that causes COVID-19. Institutional protocols and algorithms that pertain to the evaluation of patients at risk for COVID-19 are in a state of rapid change based on information released  by regulatory bodies including the CDC and federal and state organizations. These policies and algorithms were followed during the patient's care in the ED.  Some ED evaluations and interventions may be delayed as a result of limited staffing during and the pandemic.*   Note:  This document was prepared using Dragon voice recognition software and may include unintentional dictation errors.   Irean Hong, MD 07/02/20 [phone]

## 2020-07-02 NOTE — ED Triage Notes (Signed)
Patient ambulatory to triage with steady gait, without difficulty or distress noted; pt reports prod cough and congestion x 3 days, st some blood noted in sputum

## 2020-07-02 NOTE — Discharge Instructions (Addendum)
1.  You may take Tussionex as needed for cough. 2.  Use albuterol nebulizer every 4 hours as needed for cough/difficulty breathing. 3.  I have referred you to the pulmonary nodule clinic.  They should be contacting you for an appointment. 4.  Return to the ER for worsening symptoms, persistent vomiting, difficulty breathing or other concerns.

## 2020-07-02 NOTE — Progress Notes (Signed)
  Pulmonary Nodule Clinic Telephone Note [facility]   HPI: Patient was recently evaluated in [facility] ED for cough and hemoptysis.  Work-up included chest x-ray and CT chest to rule out a pulmonary embolus which incidentally noted a 5 mm left lower and left upper pulmonary nodule along with a 3 mm right upper lobe pulmonary nodule.  Review and Recommendations: I personally reviewed all patient's previous imaging including most recent chest x-ray and CT angio chest PE protocol.  I recommend follow-up with noncontrast CT scan of the chest in approximately 6 months.  Social History: Tobacco Use: High Risk  . Smoking Tobacco Use: Current Some Day Smoker  . Smokeless Tobacco Use: Never Used     High risk factors include: History of heavy smoking, exposure to asbestos, radium or uranium, personal family history of lung cancer, older age, sex (females greater than males), race (black and native [place] greater than weight), marginal speculation, upper lobe location, multiplicity (less than 5 nodules increases risk for malignancy) and emphysema and/or pulmonary fibrosis.   This recommendation follows the consensus statement: Guidelines for Management of Incidental Pulmonary Nodules Detected on CT Images: From the Fleischner Society 2017; Radiology 2017; 284:228-243.    I have placed order for CT scan without contrast to be completed at approximately 6 months.  Disposition: Order placed for repeat CT chest. Will notify Micael Hampshire in scheduling. [address] to call patient with appointment date and time. Return to pulmonary nodule clinic a few days after his repeat imaging to discuss results and plan moving forward.  Durenda Hurt, NP 07/02/2020 11:47 AM

## 2020-07-03 ENCOUNTER — Telehealth: Payer: Self-pay | Admitting: *Deleted

## 2020-07-03 NOTE — Telephone Encounter (Signed)
Message left with patient regarding referral to the lung nodule clinic as well as upcoming appts. Instructed to call back with any further questions or needs. Appt details left on message and also mailed to pt.

## 2020-11-20 ENCOUNTER — Encounter: Payer: Self-pay | Admitting: *Deleted

## 2020-11-20 NOTE — Progress Notes (Signed)
Several attempts made to contact pt to inform needs referral from [place] prior to CT scan and follow up in lung nodule clinic. Pt has not returned calls. Appts cancelled at this time since unable to authorize visits. Voicemail left with patient to inform of cancelled appts and to call back if wishes to reschedule. Letter mailed to patient as well.

## 2020-11-24 ENCOUNTER — Ambulatory Visit: Payer: No Typology Code available for payment source

## 2020-11-25 ENCOUNTER — Inpatient Hospital Stay: Payer: No Typology Code available for payment source | Admitting: Oncology

## 2021-03-24 DIAGNOSIS — T23221A Burn of second degree of single right finger (nail) except thumb, initial encounter: Secondary | ICD-10-CM | POA: Insufficient documentation

## 2021-11-09 DIAGNOSIS — G4730 Sleep apnea, unspecified: Secondary | ICD-10-CM | POA: Insufficient documentation

## 2022-01-24 DIAGNOSIS — M25562 Pain in left knee: Secondary | ICD-10-CM | POA: Insufficient documentation

## 2022-01-24 DIAGNOSIS — S83412A Sprain of medial collateral ligament of left knee, initial encounter: Secondary | ICD-10-CM | POA: Insufficient documentation

## 2022-01-24 DIAGNOSIS — S83232A Complex tear of medial meniscus, current injury, left knee, initial encounter: Secondary | ICD-10-CM | POA: Insufficient documentation

## 2022-04-18 HISTORY — PX: KNEE ARTHROSCOPY W/ MENISCECTOMY: SHX1879

## 2022-05-16 DIAGNOSIS — Z9889 Other specified postprocedural states: Secondary | ICD-10-CM | POA: Insufficient documentation

## 2022-09-07 ENCOUNTER — Ambulatory Visit
Admission: EM | Admit: 2022-09-07 | Discharge: 2022-09-07 | Disposition: A | Discharge status: Home / Self Care | Payer: No Typology Code available for payment source | Attending: Emergency Medicine | Admitting: Emergency Medicine

## 2022-09-07 DIAGNOSIS — L03032 Cellulitis of left toe: Secondary | ICD-10-CM

## 2022-09-07 MED ORDER — SULFAMETHOXAZOLE-TRIMETHOPRIM 800-160 MG PO TABS: 1.0000 | ORAL_TABLET | Freq: Two times a day (BID) | ORAL | 0 refills | Status: AC

## 2022-09-07 NOTE — Discharge Instructions (Signed)
Take the Bactrim as directed.  Follow up with a podiatrist.

## 2022-09-07 NOTE — ED Triage Notes (Signed)
Pt states he believes he may have a ingrown toenail on left big toe. That started 4 days ago. Now slight pain and redness to that toe.

## 2022-09-07 NOTE — ED Provider Notes (Signed)
John Mayer    CSN: [id number] Arrival date & time: 09/07/22  1451      History   Chief Complaint Chief Complaint  Patient presents with   Toe Pain    HPI John Mayer is a 27 y.o. male.  Patient presents with painful redness and mild swelling at the side of his left great toenail.  No trauma.  He attempted treatment by opening the area with his knife. It drained pus at that time but none since.  No fever, chills, numbness, weakness, or other symptoms.    The history is provided by the patient and medical records.    History reviewed. No pertinent past medical history.  There are no problems to display for this patient.   Past Surgical History:  Procedure Laterality Date   KNEE ARTHROSCOPY W/ MENISCECTOMY Left 2024       Home Medications    Prior to Admission medications   Medication Sig Start Date End Date Taking? Authorizing Provider  sulfamethoxazole-trimethoprim (BACTRIM DS) 800-160 MG tablet Take 1 tablet by mouth 2 (two) times daily for 7 days. 09/07/22 09/14/22 Yes Mickie Bail, NP  albuterol (PROVENTIL) (2.5 MG/3ML) 0.083% nebulizer solution Take 3 mLs (2.5 mg total) by nebulization every 4 (four) hours as needed for wheezing or shortness of breath. 07/02/20   Irean Hong, MD  chlorpheniramine-HYDROcodone [facility] PENNKINETIC ER) 10-8 MG/5ML SUER Take 5 mLs by mouth 2 (two) times daily. 07/02/20   Irean Hong, MD  gentamicin (GARAMYCIN) 0.3 % ophthalmic solution Place 1 drop into both eyes every 4 (four) hours. 06/05/20   Joni Reining, PA-C    Family History History reviewed. No pertinent family history.  Social History Social History   Tobacco Use   Smoking status: Some Days   Smokeless tobacco: Never  Vaping Use   Vaping Use: Never used  Substance Use Topics   Alcohol use: Yes   Drug use: Never     Allergies   Penicillins   Review of Systems Review of Systems  Constitutional:  Negative for chills and fever.   Musculoskeletal:  Negative for arthralgias and joint swelling.  Skin:  Positive for color change and wound.  Neurological:  Negative for weakness and numbness.     Physical Exam Triage Vital Signs ED Triage Vitals  Enc Vitals Group     BP 09/07/22 1624 121/88     Pulse Rate 09/07/22 1614 65     Resp 09/07/22 1614 18     Temp 09/07/22 1614 97.9 F (36.6 C)     Temp src --      SpO2 09/07/22 1614 98 %     Weight --      Height --      Head Circumference --      Peak Flow --      Pain Score 09/07/22 1624 1     Pain Loc --      Pain Edu? --      Excl. in GC? --    No data found.  Updated Vital Signs BP 121/88 (BP Location: Right Arm)   Pulse 65   Temp 97.9 F (36.6 C)   Resp 18   SpO2 98%   Visual Acuity Right Eye Distance:   Left Eye Distance:   Bilateral Distance:    Right Eye Near:   Left Eye Near:    Bilateral Near:     Physical Exam Constitutional:      General: He is  not in acute distress.    Appearance: Normal appearance. He is not ill-appearing.  HENT:     Mouth/Throat:     Mouth: Mucous membranes are moist.  Cardiovascular:     Rate and Rhythm: Normal rate and regular rhythm.  Pulmonary:     Effort: Pulmonary effort is normal. No respiratory distress.  Musculoskeletal:        General: Swelling and tenderness present. No deformity. Normal range of motion.  Skin:    General: Skin is warm and dry.     Findings: Erythema present.     Comments: Left great toe: tender erythema at lateral base of toenail. No purulent drainage.   Neurological:     General: No focal deficit present.     Mental Status: He is alert and oriented to person, place, and time.     Sensory: No sensory deficit.     Motor: No weakness.     Gait: Gait normal.  Psychiatric:        Mood and Affect: Mood normal.        Behavior: Behavior normal.      UC Treatments / Results  Labs (all labs ordered are listed, but only abnormal results are displayed) Labs Reviewed - No  data to display  EKG   Radiology No results found.  Procedures Incision and Drainage  Date/Time: 09/07/2022 5:07 PM  Performed by: Mickie Bail, NP Authorized by: Mickie Bail, NP   Consent:    Consent obtained:  Verbal   Consent given by:  Patient   Risks discussed:  Bleeding, incomplete drainage, infection and pain Universal protocol:    Procedure explained and questions answered to patient or proxy's satisfaction: yes   Location:    Indications for incision and drainage: paronychia.   Location:  Lower extremity   Lower extremity location:  Toe   Toe location:  L big toe Pre-procedure details:    Skin preparation:  Antiseptic wash Anesthesia:    Anesthesia method:  None Procedure type:    Complexity:  Simple Procedure details:    Incision types:  Stab incision   Drainage:  Bloody   Drainage amount:  Scant   Wound treatment:  Wound left open Post-procedure details:    Procedure completion:  Tolerated well, no immediate complications Comments:     Dressed with antibiotic ointment and nonadherent dressing.   (including critical care time)  Medications Ordered in UC Medications - No data to display  Initial Impression / Assessment and Plan / UC Course  I have reviewed the triage vital signs and the nursing notes.  Pertinent labs & imaging results that were available during my care of the patient were reviewed by me and considered in my medical decision making (see chart for details).    Paronychia of left great toe.  Stab incision with return of blood only; no purulent drainage.  Treating with Bactrim.  Wound care instructions and signs of worsening infection discussed.  Education provided on paronychia.  Instructed patient to follow-up with podiatrist.  Contact information for on-call podiatrist provided.  Patient agrees to plan of care.  Final Clinical Impressions(s) / UC Diagnoses   Final diagnoses:  Paronychia of great toe of left foot     Discharge  Instructions      Take the Bactrim as directed.  Follow up with a podiatrist.       ED Prescriptions     Medication Sig Dispense Auth. Provider   sulfamethoxazole-trimethoprim (BACTRIM DS) 800-160 MG  tablet Take 1 tablet by mouth 2 (two) times daily for 7 days. 14 tablet Mickie Bail, NP      PDMP not reviewed this encounter.   Mickie Bail, NP 09/07/22 1710

## 2022-11-02 ENCOUNTER — Encounter: Payer: Self-pay | Admitting: Physician Assistant

## 2022-11-02 ENCOUNTER — Ambulatory Visit: Payer: Self-pay | Admitting: Physician Assistant

## 2022-11-02 ENCOUNTER — Ambulatory Visit: Payer: Self-pay

## 2022-11-02 VITALS — BP 131/82 | HR 50 | Temp 98.4°F | Resp 16 | Ht 74.0 in | Wt 230.0 lb

## 2022-11-02 DIAGNOSIS — Z0289 Encounter for other administrative examinations: Secondary | ICD-10-CM

## 2022-11-02 DIAGNOSIS — Z021 Encounter for pre-employment examination: Secondary | ICD-10-CM

## 2022-11-02 LAB — POCT URINALYSIS DIPSTICK
Bilirubin, UA: NEGATIVE
Blood, UA: NEGATIVE
Glucose, UA: NEGATIVE
Ketones, UA: NEGATIVE
Leukocytes, UA: NEGATIVE
Nitrite, UA: NEGATIVE
Protein, UA: POSITIVE — AB
Spec Grav, UA: 1.025 (ref 1.010–1.025)
Urobilinogen, UA: 0.2 E.U./dL — AB
pH, UA: 6 (ref 5.0–8.0)

## 2022-11-02 NOTE — Progress Notes (Signed)
[facility] Emergency Department Provider Note  ____________________________________________   None    (approximate)  I have reviewed the triage vital signs and the nursing notes.   HISTORY  Chief Complaint Employment Physical (EKG,LABS/UA,HEPB,TB GOLD HEARING AND VISION)   HPI John Mayer is a 27 y.o. 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  Patient voiced no concerns or complaints.        No past medical history on file.  There are no problems to display for this patient.   Past Surgical History:  Procedure Laterality Date   KNEE ARTHROSCOPY W/ MENISCECTOMY Left 2024    Prior to Admission medications   Not on File    Allergies Penicillins  Family History  Family history unknown: Yes    Social History Social History   Tobacco Use   Smoking status: Some Days   Smokeless tobacco: Never  Vaping Use   Vaping status: Never Used  Substance Use Topics   Alcohol use: Yes   Drug use: Never    Review of Systems Constitutional: No fever/chills Eyes: No visual changes. ENT: No sore throat. Cardiovascular: Denies chest pain. Respiratory: Denies shortness of breath. Gastrointestinal: No abdominal pain.  No nausea, no vomiting.  No diarrhea.  No constipation. Genitourinary: Negative for dysuria. Musculoskeletal: Negative for back pain. Skin: Negative for rash. Neurological: Negative for headaches, focal weakness or numbness. Allergies: Penicillin  ____________________________________________   PHYSICAL EXAM:  VITAL SIGNS: BP 131/82  Pulse 50  Resp 16  Temp 98.4 F (36.9 C)  Temp src Temporal  SpO2 95 %  Weight 230 lb (104.3 kg)  Height 6\' 2"  (1.88 m)   BMI 29.53 kg/m2  BSA 2.33 m2  Constitutional: Alert and oriented. Well appearing and in no acute distress. Eyes: Conjunctivae are normal. PERRL. EOMI. Head: Atraumatic. Nose: No  congestion/rhinnorhea. Mouth/Throat: Mucous membranes are moist.  Oropharynx non-erythematous. Neck: No stridor.  No cervical spine tenderness to palpation. Hematological/Lymphatic/Immunilogical: No cervical lymphadenopathy. Cardiovascular: Normal rate, regular rhythm. Grossly normal heart sounds.  Good peripheral circulation. Respiratory: Normal respiratory effort.  No retractions. Lungs CTAB. Gastrointestinal: Soft and nontender. No distention. No abdominal bruits. No CVA tenderness. Genitourinary: Deferred Musculoskeletal: No lower extremity tenderness nor edema.  No joint effusions. Neurologic:  Normal speech and language. No gross focal neurologic deficits are appreciated. No gait instability. Skin:  Skin is warm, dry and intact. No rash noted. Psychiatric: Mood and affect are normal. Speech and behavior are normal.  ____________________________________________   LABS Pending ___________     Component Ref Range & Units 10:00  Color, UA amber  Clarity, UA clear  Glucose, UA Negative Negative  Bilirubin, UA neg  Ketones, UA neg  Spec Grav, UA 1.010 - 1.025 1.025  Blood, UA neg  pH, UA 5.0 - 8.0 6.0  Protein, UA Negative Positive Abnormal   Comment: trace -+  Urobilinogen, UA 0.2 or 1.0 E.U./dL 0.2 Abnormal   Nitrite, UA neg  Leukocytes, UA Negative Negative  Appearance        _________________________________ EKG  Marked bradycardia 47 bpm.  Asymptomatic ____________________________________________    ____________________________________________   INITIAL IMPRESSION / ASSESSMENT AND PLAN   As part of my medical decision making, I reviewed the following data within the electronic MEDICAL RECORD NUMBER       No acute findings on physical exam.  Patient urine shows proteinuria requiring further evaluation.  CMP and hep B labs are pending.  ____________________________________________   FINAL CLINICAL IMPRESSION  Well exam   ED Discharge Orders      None        Note:  This document was prepared using Dragon voice recognition software and may include unintentional dictation errors.

## 2022-11-02 NOTE — Progress Notes (Signed)
Pt presents today to complete FF pre-employment Physical, Pt cleared UDS & Labs are complete.  Pt denies any issues or concerns at this time/CL,RMA

## 2022-11-02 NOTE — Progress Notes (Signed)
Pt presents today to complete pre-employment FF physical. UDS is cleared.

## 2022-11-04 LAB — CMP12+LP+TP+TSH+6AC+CBC/D/PLT
ALT: 33 IU/L (ref 0–44)
AST: 21 IU/L (ref 0–40)
Albumin: 4.7 g/dL (ref 4.3–5.2)
Alkaline Phosphatase: 66 IU/L (ref 44–121)
BUN/Creatinine Ratio: 13 (ref 9–20)
BUN: 14 mg/dL (ref 6–20)
Basophils Absolute: 0 10*3/uL (ref 0.0–0.2)
Basos: 1 %
Bilirubin Total: 0.6 mg/dL (ref 0.0–1.2)
Calcium: 9.5 mg/dL (ref 8.7–10.2)
Chloride: 104 mmol/L (ref 96–106)
Chol/HDL Ratio: 4 ratio (ref 0.0–5.0)
Cholesterol, Total: 176 mg/dL (ref 100–199)
Creatinine, Ser: 1.09 mg/dL (ref 0.76–1.27)
EOS (ABSOLUTE): 0.2 10*3/uL (ref 0.0–0.4)
Eos: 5 %
Estimated CHD Risk: 0.7 times avg. (ref 0.0–1.0)
Free Thyroxine Index: 2.8 (ref 1.2–4.9)
GGT: 30 IU/L (ref 0–65)
Globulin, Total: 2.2 g/dL (ref 1.5–4.5)
Glucose: 100 mg/dL — ABNORMAL HIGH (ref 70–99)
HDL: 44 mg/dL (ref >39)
Hematocrit: 44.4 % (ref 37.5–51.0)
Hemoglobin: 15 g/dL (ref 13.0–17.7)
Immature Grans (Abs): 0 10*3/uL (ref 0.0–0.1)
Immature Granulocytes: 1 %
Iron: 93 ug/dL (ref 38–169)
LDH: 138 IU/L (ref 121–224)
LDL Chol Calc (NIH): 122 mg/dL — ABNORMAL HIGH (ref 0–99)
Lymphocytes Absolute: 1.6 10*3/uL (ref 0.7–3.1)
Lymphs: 37 %
MCH: 31.3 pg (ref 26.6–33.0)
MCHC: 33.8 g/dL (ref 31.5–35.7)
MCV: 93 fL (ref 79–97)
Monocytes Absolute: 0.4 10*3/uL (ref 0.1–0.9)
Monocytes: 10 %
Neutrophils Absolute: 2.1 10*3/uL (ref 1.4–7.0)
Neutrophils: 46 %
Phosphorus: 4.3 mg/dL — ABNORMAL HIGH (ref 2.8–4.1)
Platelets: 272 10*3/uL (ref 150–450)
Potassium: 3.8 mmol/L (ref 3.5–5.2)
RBC: 4.79 x10E6/uL (ref 4.14–5.80)
RDW: 12.1 % (ref 11.6–15.4)
Sodium: 140 mmol/L (ref 134–144)
T3 Uptake Ratio: 38 % (ref 24–39)
T4, Total: 7.4 ug/dL (ref 4.5–12.0)
TSH: 4.19 u[IU]/mL (ref 0.450–4.500)
Total Protein: 6.9 g/dL (ref 6.0–8.5)
Triglycerides: 52 mg/dL (ref 0–149)
Uric Acid: 4.5 mg/dL (ref 3.8–8.4)
VLDL Cholesterol Cal: 10 mg/dL (ref 5–40)
WBC: 4.4 10*3/uL (ref 3.4–10.8)
eGFR: 94 mL/min/{1.73_m2} (ref >59)

## 2022-11-04 LAB — QUANTIFERON-TB GOLD PLUS
QuantiFERON Mitogen Value: >10 IU/mL
QuantiFERON Nil Value: 0.04 IU/mL
QuantiFERON TB1 Ag Value: 0.04 IU/mL
QuantiFERON TB2 Ag Value: 0.04 IU/mL
QuantiFERON-TB Gold Plus: NEGATIVE

## 2022-11-04 LAB — HEPATITIS B SURFACE ANTIBODY,QUALITATIVE: Hep B Surface Ab, Qual: REACTIVE

## 2022-11-07 ENCOUNTER — Encounter: Payer: No Typology Code available for payment source | Admitting: Physician Assistant

## 2023-02-17 ENCOUNTER — Emergency Department
Admission: EM | Admit: 2023-02-17 | Discharge: 2023-02-17 | Disposition: A | Discharge status: Home / Self Care | Payer: No Typology Code available for payment source | Attending: Emergency Medicine | Admitting: Emergency Medicine

## 2023-02-17 ENCOUNTER — Other Ambulatory Visit: Payer: Self-pay

## 2023-02-17 ENCOUNTER — Encounter: Payer: Self-pay | Admitting: Emergency Medicine

## 2023-02-17 DIAGNOSIS — W231XXA Caught, crushed, jammed, or pinched between stationary objects, initial encounter: Secondary | ICD-10-CM | POA: Insufficient documentation

## 2023-02-17 DIAGNOSIS — S61210A Laceration without foreign body of right index finger without damage to nail, initial encounter: Secondary | ICD-10-CM | POA: Diagnosis not present

## 2023-02-17 DIAGNOSIS — S6991XA Unspecified injury of right wrist, hand and finger(s), initial encounter: Secondary | ICD-10-CM | POA: Diagnosis present

## 2023-02-17 NOTE — ED Triage Notes (Signed)
Pt presents POV laceration to right pointer finger. 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 Bleeding controlled at this time. Tetanus is up to date.

## 2023-02-17 NOTE — ED Provider Notes (Signed)
   [facility] Provider Note    Event Date/Time   First MD Initiated Contact with Patient 02/17/23 [phone]     (approximate)   History   Laceration   HPI  John Mayer is a 27 y.o. male who comes ED with a cut to the right index finger on the pad of the distal finger.  Finger got caught on a plastic hose.  Initially bleeding when this happened yesterday evening, but he wrapped it with gauze and tape and bleeding seems to have stopped.  No other injury.  Tetanus up-to-date.     Physical Exam   Triage Vital Signs: ED Triage Vitals  Encounter Vitals Group     BP 02/17/23 0702 138/80     Systolic BP Percentile --      Diastolic BP Percentile --      Pulse Rate 02/17/23 0702 (!) 59     Resp 02/17/23 0702 18     Temp 02/17/23 0702 97.9 F (36.6 C)     Temp Source 02/17/23 0702 Oral     SpO2 02/17/23 0702 97 %     Weight 02/17/23 0703 235 lb (106.6 kg)     Height 02/17/23 0703 6\' 2"  (1.88 m)     Head Circumference --      Peak Flow --      Pain Score 02/17/23 0703 1     Pain Loc --      Pain Education --      Exclude from Growth Chart --     Most recent vital signs: Vitals:   02/17/23 0702  BP: 138/80  Pulse: (!) 59  Resp: 18  Temp: 97.9 F (36.6 C)  SpO2: 97%    General: Awake, no distress.  CV:  Good peripheral perfusion.  Resp:  Normal effort.  Abd:  No distention.  Other:  1 cm superficial laceration on the finger pad of the right index finger.  Not through the dermis.  Hemostatic.  Not over joint.   ED Results / Procedures / Treatments   Labs (all labs ordered are listed, but only abnormal results are displayed) Labs Reviewed - No data to display   RADIOLOGY    PROCEDURES:  Procedures   MEDICATIONS ORDERED IN ED: Medications - No data to display   IMPRESSION / MDM / ASSESSMENT AND PLAN / ED COURSE  I reviewed the triage vital signs and the nursing notes.                              Patient presents with  superficial laceration of the right index finger, tendon function is intact, vascular and neurofunction intact.  Does not require sutures, Steri-Strips applied.       FINAL CLINICAL IMPRESSION(S) / ED DIAGNOSES   Final diagnoses:  Laceration of right index finger without foreign body without damage to nail, initial encounter     Rx / DC Orders   ED Discharge Orders     None        Note:  This document was prepared using Dragon voice recognition software and may include unintentional dictation errors.   Sharman Cheek, MD 02/17/23 1328

## 2023-03-06 ENCOUNTER — Telehealth: Payer: Self-pay

## 2023-03-06 NOTE — Telephone Encounter (Signed)
 Transition Care Management Unsuccessful Follow-up Telephone Call  Date of discharge and from where:  [place] 11/1  Attempts:  1st Attempt  Reason for unsuccessful TCM follow-up call:  No answer/busy   John Mayer  [facility], [facility] Guide, Phone: [phone] Website: [email or web address]

## 2023-03-06 NOTE — Telephone Encounter (Signed)
Transition Care Management Unsuccessful Follow-up Telephone Call  Date of discharge and from where:  [place] 11/1  Attempts:  2nd Attempt  Reason for unsuccessful TCM follow-up call:  No answer/busy   [facility]  [facility], [facility] Guide, Phone: [phone] Website: [email or web address]

## 2023-04-24 DIAGNOSIS — F428 Other obsessive-compulsive disorder: Secondary | ICD-10-CM | POA: Diagnosis not present

## 2023-05-01 DIAGNOSIS — F428 Other obsessive-compulsive disorder: Secondary | ICD-10-CM | POA: Diagnosis not present

## 2023-09-04 ENCOUNTER — Other Ambulatory Visit: Payer: Self-pay

## 2023-09-04 DIAGNOSIS — Z0283 Encounter for blood-alcohol and blood-drug test: Secondary | ICD-10-CM

## 2023-09-04 NOTE — Progress Notes (Signed)
 Random UDS and BAT completed after consents per [facility] protocol.

## 2023-10-06 DIAGNOSIS — R197 Diarrhea, unspecified: Secondary | ICD-10-CM | POA: Insufficient documentation

## 2023-10-06 DIAGNOSIS — Z0110 Encounter for examination of ears and hearing without abnormal findings: Secondary | ICD-10-CM | POA: Insufficient documentation

## 2023-10-06 DIAGNOSIS — Z720 Tobacco use: Secondary | ICD-10-CM | POA: Insufficient documentation

## 2023-10-09 ENCOUNTER — Encounter

## 2023-10-09 DIAGNOSIS — Z0289 Encounter for other administrative examinations: Secondary | ICD-10-CM

## 2023-10-23 ENCOUNTER — Ambulatory Visit: Payer: Self-pay

## 2023-10-23 DIAGNOSIS — Z0289 Encounter for other administrative examinations: Secondary | ICD-10-CM

## 2023-10-23 DIAGNOSIS — Z0000 Encounter for general adult medical examination without abnormal findings: Secondary | ICD-10-CM

## 2023-10-23 LAB — POCT URINALYSIS DIPSTICK
Bilirubin, UA: NEGATIVE
Blood, UA: NEGATIVE
Glucose, UA: NEGATIVE
Ketones, UA: NEGATIVE
Nitrite, UA: NEGATIVE
Protein, UA: NEGATIVE
Spec Grav, UA: 1.025 (ref 1.010–1.025)
Urobilinogen, UA: 0.2 U/dL
pH, UA: 6 (ref 5.0–8.0)

## 2023-10-25 ENCOUNTER — Encounter: Payer: Self-pay | Admitting: Physician Assistant

## 2023-10-25 ENCOUNTER — Ambulatory Visit: Payer: Self-pay | Admitting: Physician Assistant

## 2023-10-25 VITALS — BP 112/74 | HR 54 | Temp 97.6°F | Resp 14 | Ht 74.0 in | Wt 230.0 lb

## 2023-10-25 DIAGNOSIS — N489 Disorder of penis, unspecified: Secondary | ICD-10-CM

## 2023-10-25 DIAGNOSIS — Z0000 Encounter for general adult medical examination without abnormal findings: Secondary | ICD-10-CM

## 2023-10-25 LAB — CMP12+LP+TP+TSH+6AC+CBC/D/PLT
ALT: 35 IU/L (ref 0–44)
AST: 27 IU/L (ref 0–40)
Albumin: 4.6 g/dL (ref 4.3–5.2)
Alkaline Phosphatase: 65 IU/L (ref 44–121)
BUN/Creatinine Ratio: 13 (ref 9–20)
BUN: 15 mg/dL (ref 6–20)
Basophils Absolute: 0.1 x10E3/uL (ref 0.0–0.2)
Basos: 1 %
Bilirubin Total: 0.5 mg/dL (ref 0.0–1.2)
Calcium: 9.4 mg/dL (ref 8.7–10.2)
Chloride: 104 mmol/L (ref 96–106)
Chol/HDL Ratio: 4 ratio (ref 0.0–5.0)
Cholesterol, Total: 182 mg/dL (ref 100–199)
Creatinine, Ser: 1.18 mg/dL (ref 0.76–1.27)
EOS (ABSOLUTE): 0.5 x10E3/uL — ABNORMAL HIGH (ref 0.0–0.4)
Eos: 11 %
Estimated CHD Risk: 0.7 times avg. (ref 0.0–1.0)
Free Thyroxine Index: 2 (ref 1.2–4.9)
GGT: 32 IU/L (ref 0–65)
Globulin, Total: 2.3 g/dL (ref 1.5–4.5)
Glucose: 88 mg/dL (ref 70–99)
HDL: 46 mg/dL (ref >39)
Hematocrit: 43 % (ref 37.5–51.0)
Hemoglobin: 14.4 g/dL (ref 13.0–17.7)
Immature Grans (Abs): 0 x10E3/uL (ref 0.0–0.1)
Immature Granulocytes: 0 %
Iron: 171 ug/dL — ABNORMAL HIGH (ref 38–169)
LDH: 146 IU/L (ref 121–224)
LDL Chol Calc (NIH): 118 mg/dL — ABNORMAL HIGH (ref 0–99)
Lymphocytes Absolute: 1.5 x10E3/uL (ref 0.7–3.1)
Lymphs: 35 %
MCH: 32.7 pg (ref 26.6–33.0)
MCHC: 33.5 g/dL (ref 31.5–35.7)
MCV: 98 fL — ABNORMAL HIGH (ref 79–97)
Monocytes Absolute: 0.4 x10E3/uL (ref 0.1–0.9)
Monocytes: 10 %
Neutrophils Absolute: 1.9 x10E3/uL (ref 1.4–7.0)
Neutrophils: 43 %
Phosphorus: 3.3 mg/dL (ref 2.8–4.1)
Platelets: 258 x10E3/uL (ref 150–450)
Potassium: 4.3 mmol/L (ref 3.5–5.2)
RBC: 4.4 x10E6/uL (ref 4.14–5.80)
RDW: 12.5 % (ref 11.6–15.4)
Sodium: 138 mmol/L (ref 134–144)
T3 Uptake Ratio: 34 % (ref 24–39)
T4, Total: 6 ug/dL (ref 4.5–12.0)
TSH: 2.71 u[IU]/mL (ref 0.450–4.500)
Total Protein: 6.9 g/dL (ref 6.0–8.5)
Triglycerides: 100 mg/dL (ref 0–149)
Uric Acid: 4.3 mg/dL (ref 3.8–8.4)
VLDL Cholesterol Cal: 18 mg/dL (ref 5–40)
WBC: 4.4 x10E3/uL (ref 3.4–10.8)
eGFR: 85 mL/min/1.73 (ref >59)

## 2023-10-25 LAB — POCT URINALYSIS DIPSTICK
Bilirubin, UA: NEGATIVE
Blood, UA: NEGATIVE
Glucose, UA: NEGATIVE
Ketones, UA: NEGATIVE
Leukocytes, UA: NEGATIVE
Nitrite, UA: NEGATIVE
Protein, UA: POSITIVE — AB
Spec Grav, UA: 1.025 (ref 1.010–1.025)
Urobilinogen, UA: 0.2 U/dL
pH, UA: 6 (ref 5.0–8.0)

## 2023-10-25 NOTE — Progress Notes (Signed)
 [facility] [place] [facility]  ____________________________________________   None    (approximate)  I have reviewed the triage vital signs and the nursing notes.   HISTORY  Chief Complaint Annual Exam   HPI John Mayer is a 28 y.o. male patient presents for annual physical exam.  Patient stated wish to establish care with this clinic.  Close concern for attention deficit.  Patient stated was diagnosed with attention deficit as a teenager and was placed on medication but stopped it years ago.  Patient also concern for rash on glans of the penis for approximately 6 months.  No associated signs of infection.  Patient also states sun exposure rash on the upper extremities.  Condition results in weeping vesicle lesions.        History reviewed. No pertinent past medical history.  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   Elbow pain 12/05/2019   Acute vesicular eczema of foot 08/23/2017   Nicotine dependence 08/23/2017   Tinea corporis 08/23/2017   Urethral discharge in male 06/08/2017   Migraine 09/29/2016   Anxiety 03/31/2016   Low back pain 03/31/2016   Headache 02/24/2016    Past Surgical History:  Procedure Laterality Date   KNEE ARTHROSCOPY W/ MENISCECTOMY Left 2024    Prior to Admission medications   Not on File    Allergies Penicillins  Family History  Family history unknown: Yes     Social History Social History   Tobacco Use   Smoking status: Some Days   Smokeless tobacco: Never  Vaping Use   Vaping status: Never Used  Substance Use Topics   Alcohol use: Yes   Drug use: Never    Review of Systems Constitutional: No fever/chills Eyes: No visual changes. ENT: No sore throat. Cardiovascular: Denies chest pain. Respiratory: Denies shortness of breath. Gastrointestinal: No abdominal pain.  No nausea, no vomiting.  No diarrhea.  No constipation. Genitourinary: Negative for dysuria. Musculoskeletal: Negative for back pain. Skin: Positive for rash on upper extremities.  Penile lesions. Neurological: Negative for headaches, focal weakness or numbness. Psychiatric: Attention deficit Allergic/Immunilogical: Penicillin ____________________________________________   PHYSICAL EXAM:  VITAL SIGNS:  Constitutional: Alert and oriented. Well appearing and in no acute distress. Eyes: Conjunctivae are normal. PERRL. EOMI. Head: Atraumatic. Nose: No congestion/rhinnorhea. Mouth/Throat: Mucous membranes are moist.  Oropharynx non-erythematous. Neck: No stridor. No cervical spine tenderness to palpation. Hematological/Lymphatic/Immunilogical: No cervical lymphadenopathy. Cardiovascular: Normal rate, regular rhythm. Grossly normal heart sounds.  Good peripheral circulation. Respiratory: Normal respiratory effort.  No retractions. Lungs CTAB. Gastrointestinal: Soft and nontender. No distention. No abdominal bruits. No CVA tenderness. Genitourinary: Physical exam shows multiple small papular lesion on glans of penis. Musculoskeletal: No lower extremity tenderness nor edema.  No joint effusions. Neurologic:  Normal speech and language. No gross focal neurologic deficits are appreciated. No gait instability. Skin:  Skin is warm, dry and intact. No rash noted. Psychiatric: Mood and  affect are normal. Speech and behavior are  normal.  ____________________________________________   LABS _     Component Ref Range & Units (hover) 2 d ago 11 mo ago  Color, UA Dark Yellow amber  Clarity, UA Clear clear  Glucose, UA Negative Negative  Bilirubin, UA Negative neg  Ketones, UA Negative neg  Spec Grav, UA 1.025 1.025  Blood, UA Negative neg  pH, UA 6.0 6.0  Protein, UA Negative Positive Abnormal  CM  Urobilinogen, UA 0.2 0.2 Abnormal   Nitrite, UA Negative neg  Leukocytes, UA Trace Abnormal  Negative  Appearance    Odor                Component Ref Range & Units (hover) 2 d ago (10/23/23) 11 mo ago (11/02/22) 3 yr ago (07/02/20) 3 yr ago (07/02/20) 5 yr ago (04/18/18) 5 yr ago (04/18/18)  Glucose 88 100 High   96 CM 88   Uric Acid 4.3 4.5 CM      Comment:            Therapeutic target for gout patients: <6.0  BUN 15 14  15 16    Creatinine, Ser 1.18 1.09  1.04 R 1.08 R   eGFR 85 94      BUN/Creatinine Ratio 13 13      Sodium 138 140  138 R 138 R   Potassium 4.3 3.8  3.7 R 4.7 R   Chloride 104 104  105 R 104 R   Calcium 9.4 9.5  9.0 R 9.7 R   Phosphorus 3.3 4.3 High       Total Protein 6.9 6.9      Albumin 4.6 4.7      Globulin, Total 2.3 2.2      Bilirubin Total 0.5 0.6      Alkaline Phosphatase 65 66      LDH 146 138      AST 27 21      ALT 35 33      GGT 32 30      Iron 171 High  93      Cholesterol, Total 182 176      Triglycerides 100 52      HDL 46 44      VLDL Cholesterol Cal 18 10      LDL Chol Calc (NIH) 118 High  122 High       Chol/HDL Ratio 4.0 4.0 CM      Comment:                                   T. Chol/HDL Ratio                                             Men  Women                               1/2 Avg.Risk  3.4    3.3                                   Avg.Risk  5.0    4.4  2X Avg.Risk  9.6    7.1                                3X Avg.Risk 23.4   11.0  Estimated CHD Risk 0.7 0.7 CM      Comment: The CHD Risk is based on the T. Chol/HDL ratio.  Other factors affect CHD Risk such as hypertension, smoking, diabetes, severe obesity, and family history of premature CHD.  TSH 2.710 4.190      T4, Total 6.0 7.4      T3 Uptake Ratio 34 38      Free Thyroxine Index 2.0 2.8      WBC 4.4 4.4 10.6 High  R   6.5 R  RBC 4.40 4.79 4.18 Low  R   4.60 R  Hemoglobin 14.4 15.0 13.8 R   15.1 R  Hematocrit 43.0 44.4 39.2 R   42.8 R  MCV 98 High  93 93.8 R   93.0 R  MCH 32.7 31.3 33.0 R   32.8 R  MCHC 33.5 33.8 35.2 R   35.3 R  RDW 12.5 12.1 12.0 R   11.1 Low  R  Platelets 258 272 251 R   279 R  Neutrophils 43 46 69 R   61 R  Lymphs 35 37      Monocytes 10 10      Eos 11 5      Basos 1 1      Neutrophils Absolute 1.9 2.1 7.3 R   4.0 R  Lymphocytes Absolute 1.5 1.6 1.6 R   1.7 R  Monocytes Absolute 0.4 0.4      EOS (ABSOLUTE) 0.5 High  0.2      Basophils Absolute 0.1 0.0 0.1 R   0.1 R  Immature Granulocytes 0 1 0 R   1 R  Immature Grans (Abs) 0.0 0.0                      ___________________________________________  EKG  Marked asymptomatic bradycardia 43 bpm ____________________________________________    ____________________________________________   INITIAL IMPRESSION / ASSESSMENT AND PLAN  As part of my medical decision making, I reviewed the following data within the electronic MEDICAL RECORD NUMBER      Physical exam reveals lesion suggestive of  verneral  warts.  History of sun rash follow-up physical findings today.  Will consult to dermatology.  Needs evaluation ADHD.       ____________________________________________   FINAL CLINICAL IMPRESSION(S) / ED DIAGNOSES  @[facility] @   ED Discharge Orders     None        Note:  This document was prepared using Dragon voice recognition software and may include unintentional dictation errors.

## 2023-10-25 NOTE — Progress Notes (Signed)
 Pt presents today to complete physical, Pt has a couple of concerns he would like to discuss. KATHERINE KRAFT

## 2023-10-25 NOTE — Addendum Note (Signed)
 Addended by: ELUTERIO JENKINS HERO on: 10/25/2023 10:53 AM   Modules accepted: Orders

## 2023-10-26 NOTE — Addendum Note (Signed)
 Addended by: ELUTERIO JENKINS HERO on: 10/26/2023 11:23 AM   Modules accepted: Orders

## 2023-11-16 DIAGNOSIS — L814 Other melanin hyperpigmentation: Secondary | ICD-10-CM | POA: Diagnosis not present

## 2023-11-16 DIAGNOSIS — A630 Anogenital (venereal) warts: Secondary | ICD-10-CM | POA: Diagnosis not present

## 2023-11-16 DIAGNOSIS — R238 Other skin changes: Secondary | ICD-10-CM | POA: Diagnosis not present

## 2023-11-16 DIAGNOSIS — L309 Dermatitis, unspecified: Secondary | ICD-10-CM | POA: Diagnosis not present

## 2023-11-16 DIAGNOSIS — D2262 Melanocytic nevi of left upper limb, including shoulder: Secondary | ICD-10-CM | POA: Diagnosis not present

## 2023-11-16 DIAGNOSIS — D2271 Melanocytic nevi of right lower limb, including hip: Secondary | ICD-10-CM | POA: Diagnosis not present

## 2023-11-16 DIAGNOSIS — D2272 Melanocytic nevi of left lower limb, including hip: Secondary | ICD-10-CM | POA: Diagnosis not present

## 2023-11-16 DIAGNOSIS — D2261 Melanocytic nevi of right upper limb, including shoulder: Secondary | ICD-10-CM | POA: Diagnosis not present

## 2023-11-16 DIAGNOSIS — D225 Melanocytic nevi of trunk: Secondary | ICD-10-CM | POA: Diagnosis not present

## 2023-11-16 DIAGNOSIS — L0889 Other specified local infections of the skin and subcutaneous tissue: Secondary | ICD-10-CM | POA: Diagnosis not present

## 2024-04-15 DIAGNOSIS — S81819A Laceration without foreign body, unspecified lower leg, initial encounter: Secondary | ICD-10-CM | POA: Insufficient documentation
